# Patient Record
Sex: Male | Born: 1965 | Race: Black or African American | Hispanic: No | Marital: Single | Smoking: Current every day smoker
Health system: Southern US, Community
[De-identification: ages and names within clinical notes are randomized; demographics above are authoritative.]

---

## 2019-07-13 ENCOUNTER — Emergency Department (HOSPITAL_COMMUNITY)
Admission: EM | Admit: 2019-07-13 | Discharge: 2019-07-13 | Disposition: A | Discharge status: Home / Self Care | Attending: Emergency Medicine | Admitting: Emergency Medicine

## 2019-07-13 ENCOUNTER — Other Ambulatory Visit: Payer: Self-pay

## 2019-07-13 ENCOUNTER — Encounter (HOSPITAL_COMMUNITY): Payer: Self-pay | Admitting: Emergency Medicine

## 2019-07-13 ENCOUNTER — Emergency Department (HOSPITAL_COMMUNITY)

## 2019-07-13 DIAGNOSIS — Y999 Unspecified external cause status: Secondary | ICD-10-CM | POA: Diagnosis not present

## 2019-07-13 DIAGNOSIS — Y929 Unspecified place or not applicable: Secondary | ICD-10-CM | POA: Diagnosis not present

## 2019-07-13 DIAGNOSIS — Y9389 Activity, other specified: Secondary | ICD-10-CM | POA: Insufficient documentation

## 2019-07-13 DIAGNOSIS — W010XXA Fall on same level from slipping, tripping and stumbling without subsequent striking against object, initial encounter: Secondary | ICD-10-CM | POA: Diagnosis not present

## 2019-07-13 DIAGNOSIS — S82831A Other fracture of upper and lower end of right fibula, initial encounter for closed fracture: Secondary | ICD-10-CM

## 2019-07-13 DIAGNOSIS — F17210 Nicotine dependence, cigarettes, uncomplicated: Secondary | ICD-10-CM | POA: Insufficient documentation

## 2019-07-13 DIAGNOSIS — S99911A Unspecified injury of right ankle, initial encounter: Secondary | ICD-10-CM | POA: Diagnosis present

## 2019-07-13 MED ORDER — HYDROCODONE-ACETAMINOPHEN 5-325 MG PO TABS
2.0000 | ORAL_TABLET | Freq: Once | ORAL | Status: AC
  Administered 2019-07-13: 2 via ORAL
  Filled 2019-07-13: qty 2

## 2019-07-13 MED ORDER — IBUPROFEN 800 MG PO TABS: 800.0000 mg | ORAL_TABLET | Freq: Three times a day (TID) | ORAL | 0 refills | Status: AC

## 2019-07-13 NOTE — ED Provider Notes (Signed)
[facility]-EMERGENCY DEPT Provider Note   CSN: [id number] Arrival date & time: 07/13/19  0010     History   Chief Complaint Chief Complaint  Patient presents with  . Ankle Injury    HPI Ronald Gibbs is a 53 y.o. male.     Patient presents to the emergency department with complaint of right ankle pain.  He states he slipped on a bag.  States that he was arguing with family member.  Complains of pain with palpation and walking.  Denies taking anything for symptoms.  Pain is improved with immobilization.  Denies numbness or tingling.  The history is provided by the patient. No language interpreter was used.    History reviewed. No pertinent past medical history.  There are no active problems to display for this patient.   History reviewed. No pertinent surgical history.      Home Medications    Prior to Admission medications   Medication Sig Start Date End Date Taking? Authorizing Provider  ibuprofen (ADVIL) 800 MG tablet Take 1 tablet (800 mg total) by mouth 3 (three) times daily. 07/13/19   Roxy Horseman, PA-C    Family History History reviewed. No pertinent family history.  Social History Social History   Tobacco Use  . Smoking status: Current Every Day Smoker  . Smokeless tobacco: Never Used  Substance Use Topics  . Alcohol use: Not Currently  . Drug use: Not Currently     Allergies   Patient has no known allergies.   Review of Systems Review of Systems  All other systems reviewed and are negative.    Physical Exam Updated Vital Signs BP (!) 170/104   Pulse 98   Temp 98.1 F (36.7 C) (Oral)   Resp (!) 22   Ht 5\' 9"  (1.753 m)   Wt 63.5 kg   SpO2 97%   BMI 20.67 kg/m   Physical Exam  Nursing note and vitals reviewed.  Constitutional: Pt appears well-developed and well-nourished. No distress.  HENT:  Head: Normocephalic and atraumatic.  Eyes: Conjunctivae are normal.  Neck: Normal range of motion.   Cardiovascular: Normal rate, regular rhythm. Intact distal pulses.   Capillary refill < 3 sec.  Pulmonary/Chest: Effort normal and breath sounds normal.  Musculoskeletal:  TTP over the right lateral malleolus  ROM: Limited secondary to pain  Strength: Deferred Neurological: Pt  is alert. Coordination normal.  Sensation: 5/5 Skin: Skin is warm and dry. Pt is not diaphoretic.  No evidence of open wound or skin tenting Psychiatric: Pt has a normal mood and affect.    ED Treatments / Results  Labs (all labs ordered are listed, but only abnormal results are displayed) Labs Reviewed - No data to display  EKG None  Radiology Dg Ankle Complete Right  Result Date: 07/13/2019 CLINICAL DATA:  Injury, ankle pain EXAM: RIGHT ANKLE - COMPLETE 3+ VIEW COMPARISON:  None. FINDINGS: There is a nondisplaced fracture seen through the distal fibula below the level of the ankle mortise. Tiny ossific fragments are seen adjacent to the lateral malleolus. Overlying soft tissue swelling. There is diffuse osteopenia. The ankle mortise is congruent. Cortical thickening is seen of the distal tibia posteriorly which could be from prior injury. IMPRESSION: Nondisplaced distal fibular fracture. Electronically Signed   By: 07/15/2019 M.D.   On: 07/13/2019 01:10    Procedures Procedures (including critical care time)  Medications Ordered in ED Medications  HYDROcodone-acetaminophen (NORCO/VICODIN) 5-325 MG per tablet 2 tablet (2 tablets Oral Given  07/13/19 0122)     Initial Impression / Assessment and Plan / ED Course  I have reviewed the triage vital signs and the nursing notes.  Pertinent labs & imaging results that were available during my care of the patient were reviewed by me and considered in my medical decision making (see chart for details).        Patient presents with injury to right ankle.  DDx includes, fracture, strain, or sprain.  Consultants: none  Plain films reveal  non-displaced distal fibula fx.  Pt advised to follow up with PCP and/or orthopedics. Patient given cam walker and crutches while in ED, conservative therapy such as RICE recommended and discussed.   Patient will be discharged home & is agreeable with above plan. Returns precautions discussed. Pt appears safe for discharge.   Final Clinical Impressions(s) / ED Diagnoses   Final diagnoses:  Closed fracture of distal end of right fibula, unspecified fracture morphology, initial encounter    ED Discharge Orders         Ordered    ibuprofen (ADVIL) 800 MG tablet  3 times daily     07/13/19 0123           Montine Circle, PA-C 07/13/19 0125    Palumbo, April, MD 07/13/19 [phone]

## 2019-07-13 NOTE — Discharge Instructions (Signed)
Wear the boot and use crutches as needed.  Your fibula should heal in approximately 6 weeks.  Please follow-up with orthopedic doctor provided, or you may contact your own orthopedic doctor.

## 2019-07-13 NOTE — ED Triage Notes (Signed)
Patient is complaining of slipping on a bag and hurt his right ankle. He is also complaining of left arm pain. Patient states it happened tonight.

## 2020-10-03 IMAGING — CR DG ANKLE COMPLETE 3+V*R*
3 series · 3 of 3 positions shown · non-contrast
Comparison: None.

CLINICAL DATA: Injury, ankle pain

EXAM:
RIGHT ANKLE - COMPLETE 3+ VIEW

[x ankle ap right]
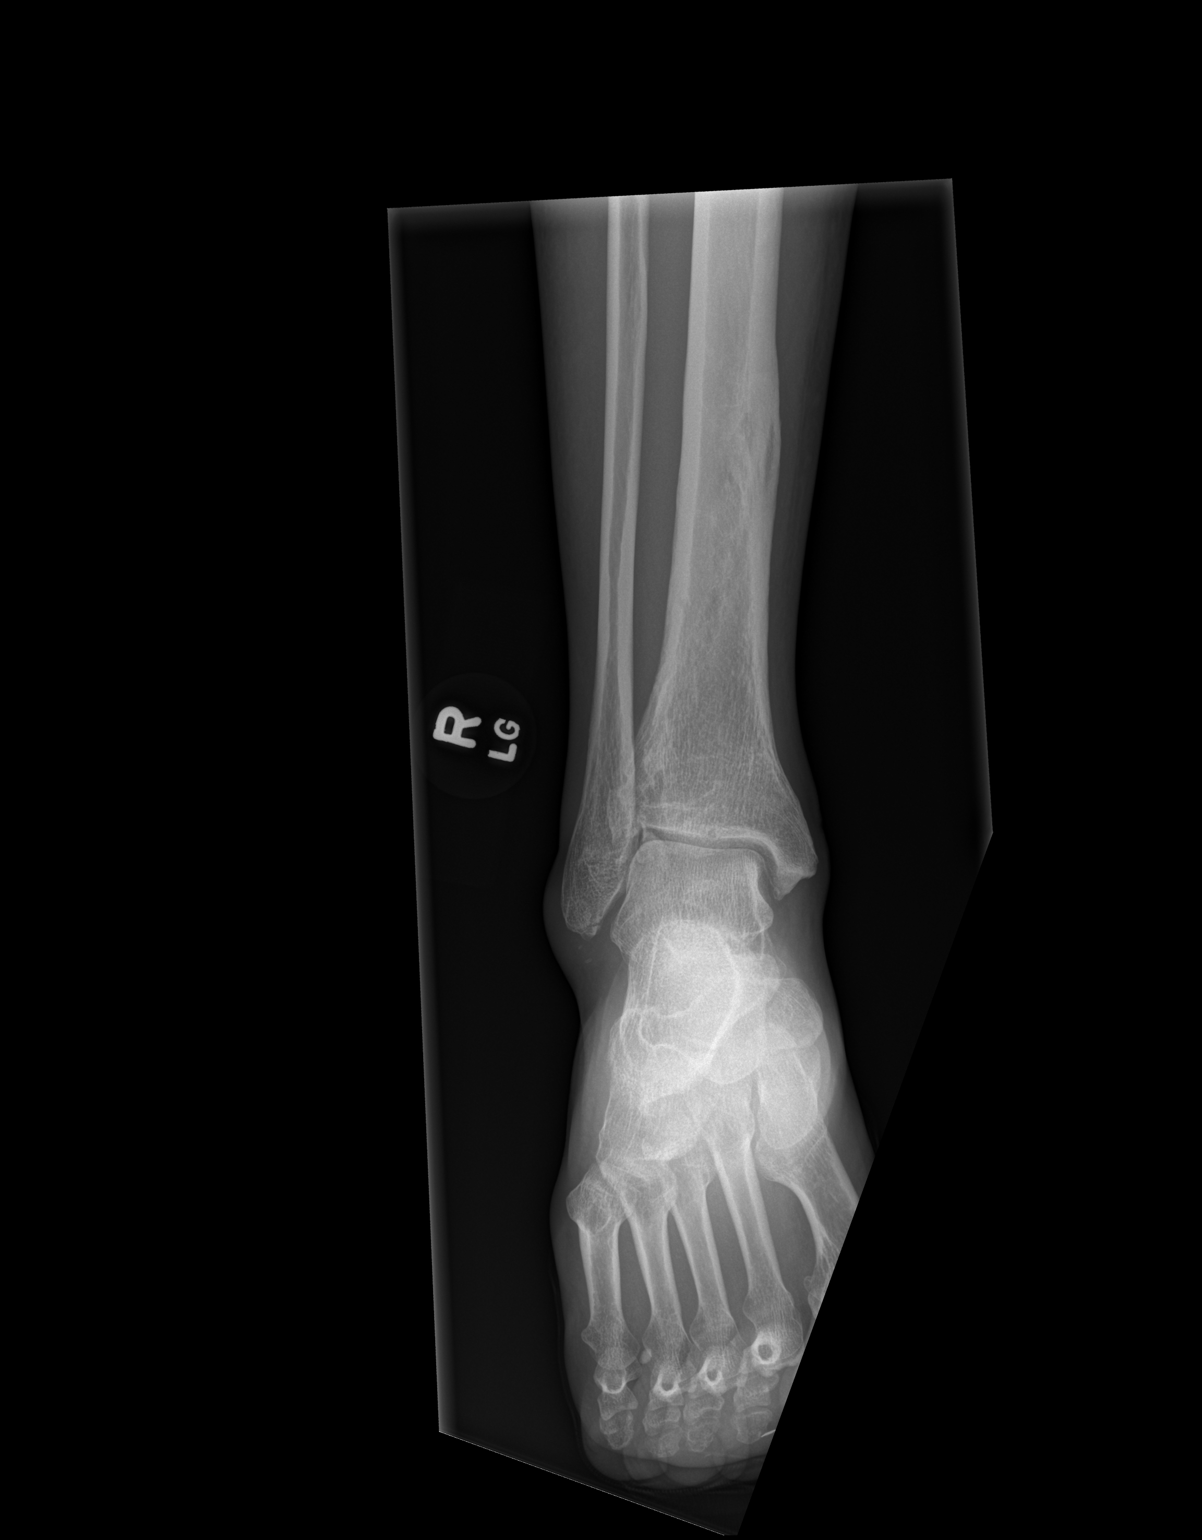

[x ankle obl right]
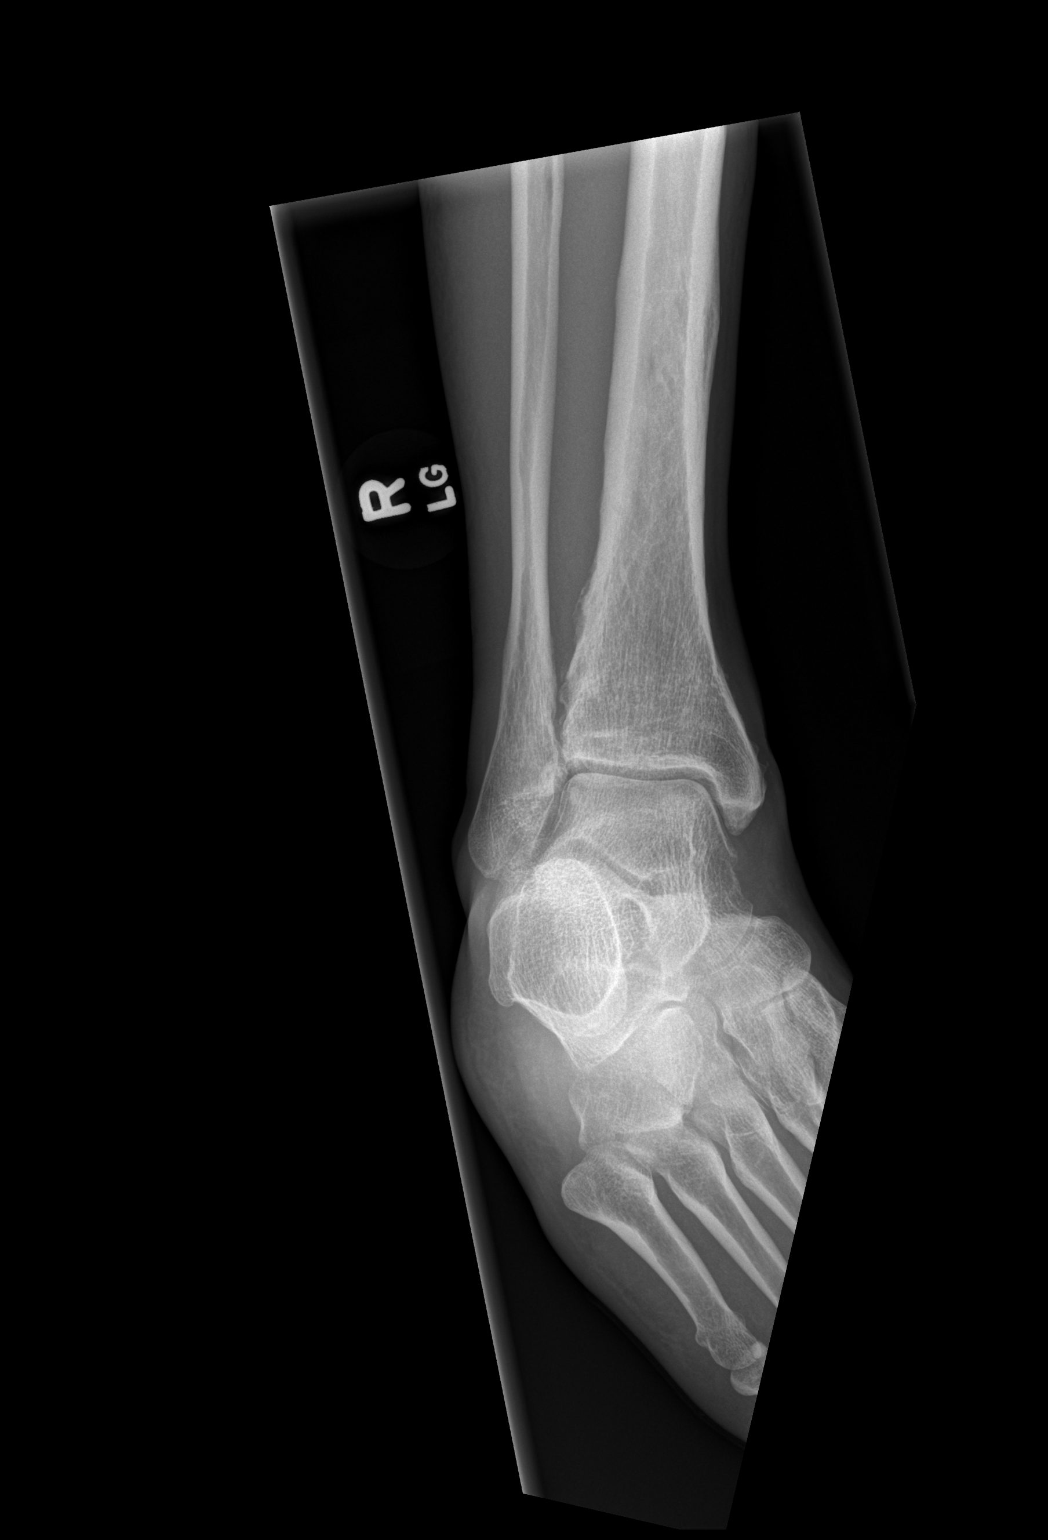

[x ankle lat right]
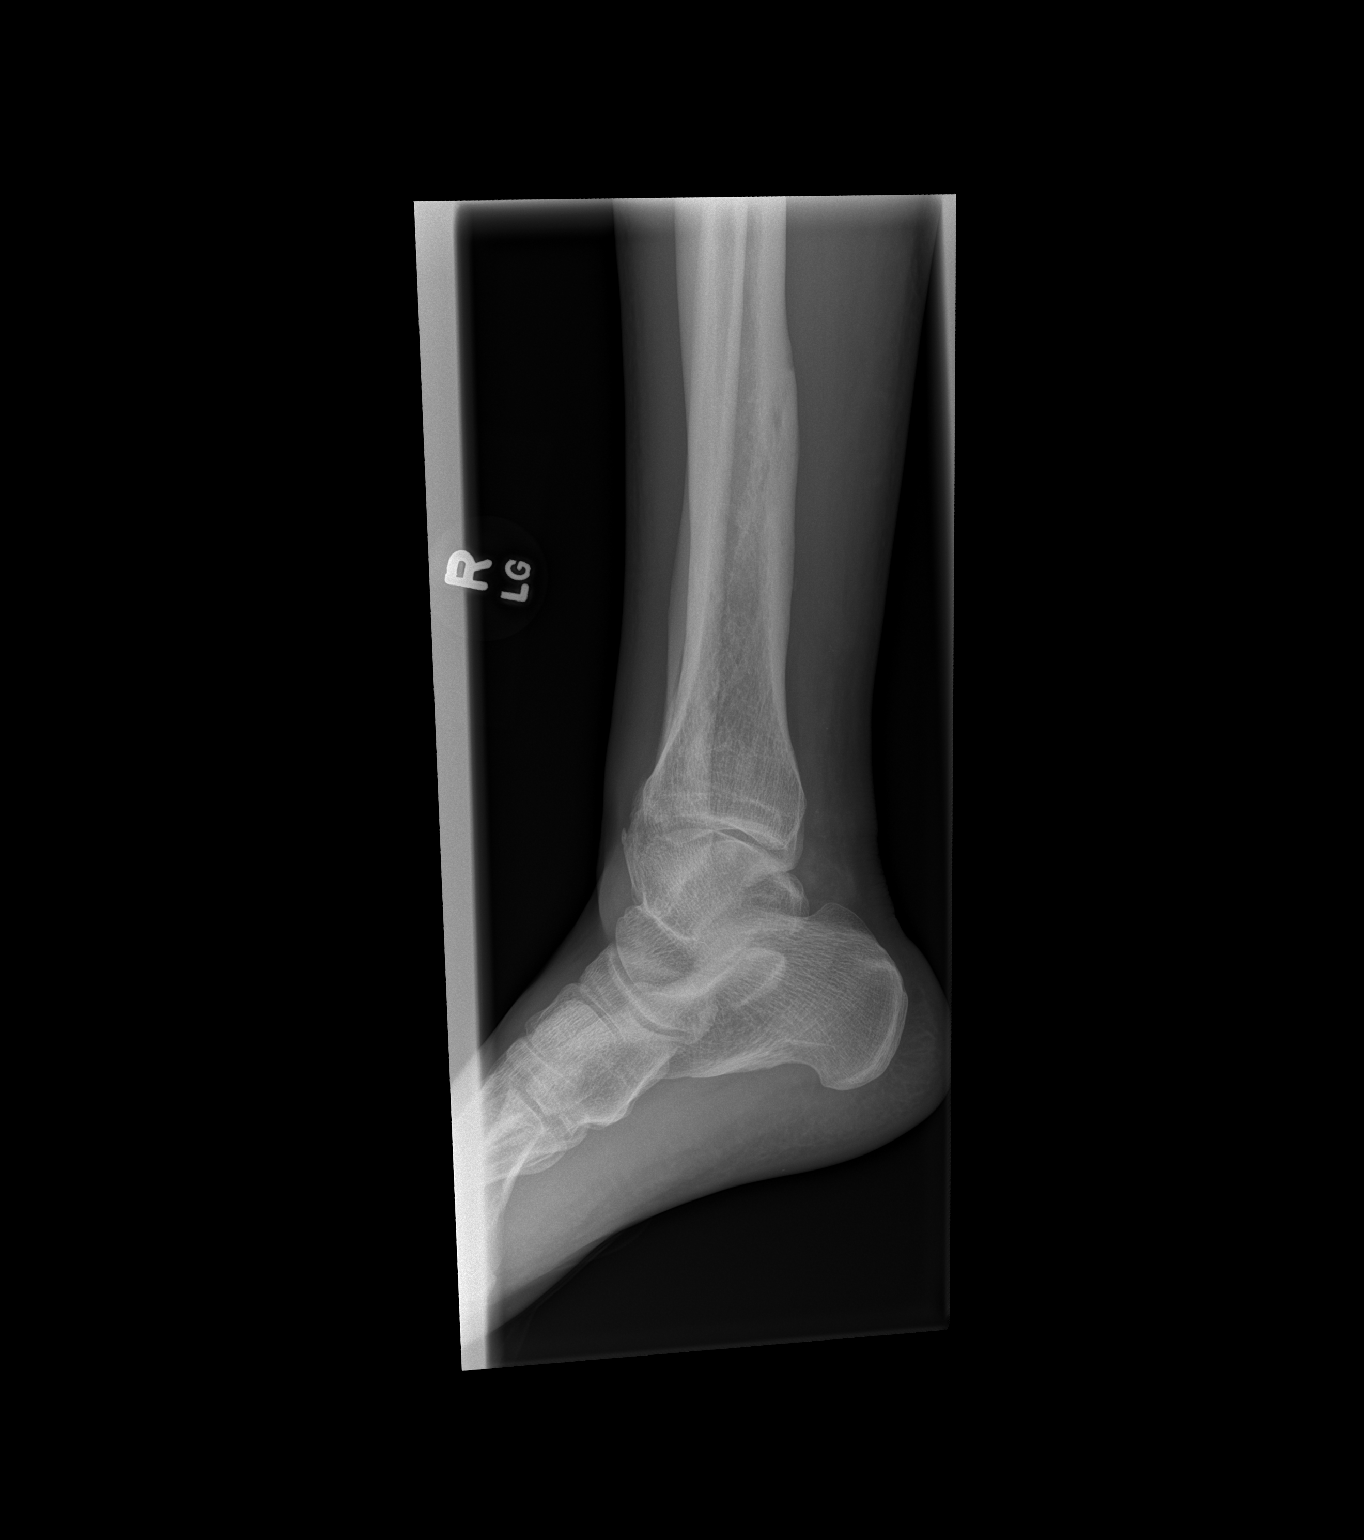

[3 of 3 positions shown; findings below may reference images not displayed]

FINDINGS: There is a nondisplaced fracture seen through the distal fibula
below the level of the ankle mortise. Tiny ossific fragments are
seen adjacent to the lateral malleolus. Overlying soft tissue
swelling. There is diffuse osteopenia. The ankle mortise is
congruent. Cortical thickening is seen of the distal tibia
posteriorly which could be from prior injury.
IMPRESSION: Nondisplaced distal fibular fracture.
